# Patient Record
Sex: Male | Born: 1980 | Race: White | Hispanic: No | Marital: Married | Smoking: Never smoker
Health system: Southern US, Community
[De-identification: ages and names within clinical notes are randomized; demographics above are authoritative.]

## PROBLEM LIST (undated history)

## (undated) DIAGNOSIS — Z8781 Personal history of (healed) traumatic fracture: Secondary | ICD-10-CM

---

## 2003-10-02 DIAGNOSIS — R011 Cardiac murmur, unspecified: Secondary | ICD-10-CM

## 2003-10-02 HISTORY — DX: Cardiac murmur, unspecified: R01.1

## 2008-03-29 ENCOUNTER — Emergency Department (HOSPITAL_COMMUNITY): Admission: EM | Admit: 2008-03-29 | Discharge: 2008-03-30 | Payer: Self-pay | Admitting: Emergency Medicine

## 2008-03-30 ENCOUNTER — Encounter: Payer: Self-pay | Admitting: Internal Medicine

## 2008-04-01 ENCOUNTER — Ambulatory Visit: Payer: Self-pay | Admitting: Internal Medicine

## 2008-04-01 DIAGNOSIS — S129XXA Fracture of neck, unspecified, initial encounter: Secondary | ICD-10-CM | POA: Insufficient documentation

## 2008-04-01 DIAGNOSIS — K5900 Constipation, unspecified: Secondary | ICD-10-CM | POA: Insufficient documentation

## 2008-04-01 DIAGNOSIS — R209 Unspecified disturbances of skin sensation: Secondary | ICD-10-CM

## 2008-04-01 DIAGNOSIS — M5000 Cervical disc disorder with myelopathy, unspecified cervical region: Secondary | ICD-10-CM

## 2008-04-01 DIAGNOSIS — S139XXA Sprain of joints and ligaments of unspecified parts of neck, initial encounter: Secondary | ICD-10-CM

## 2008-04-14 ENCOUNTER — Encounter: Payer: Self-pay | Admitting: Internal Medicine

## 2008-04-15 ENCOUNTER — Encounter: Admission: RE | Admit: 2008-04-15 | Discharge: 2008-04-15 | Payer: Self-pay | Admitting: Neurological Surgery

## 2008-05-13 ENCOUNTER — Encounter: Payer: Self-pay | Admitting: Internal Medicine

## 2008-05-28 ENCOUNTER — Encounter: Payer: Self-pay | Admitting: Internal Medicine

## 2008-06-29 DIAGNOSIS — R519 Headache, unspecified: Secondary | ICD-10-CM | POA: Insufficient documentation

## 2008-07-28 ENCOUNTER — Encounter: Payer: Self-pay | Admitting: Internal Medicine

## 2008-12-08 DIAGNOSIS — J301 Allergic rhinitis due to pollen: Secondary | ICD-10-CM | POA: Insufficient documentation

## 2010-08-04 ENCOUNTER — Telehealth (INDEPENDENT_AMBULATORY_CARE_PROVIDER_SITE_OTHER): Payer: Self-pay | Admitting: *Deleted

## 2010-08-30 DIAGNOSIS — I495 Sick sinus syndrome: Secondary | ICD-10-CM | POA: Insufficient documentation
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
## 2010-10-31 NOTE — Progress Notes (Signed)
Summary: Pt switch from Dr Posey Rea to Dr Odette Fraction MD(s) ok  ---- Converted from flag ---- ---- 08/02/2010 5:37 PM, Tresa Garter MD wrote: ok with me Thank you!   ---- 08/02/2010 9:55 AM, Ivar Bury wrote: Dr Posey Rea and Dr Felicity Coyer, Dr Posey Rea your pt Howell Pringle desire to switch to Dr Felicity Coyer.  Thank you both for your reply. ------------------------------  Gave appt:  08/31/10 @ 8A w/Dr Mayra Neer

## 2012-07-02 ENCOUNTER — Ambulatory Visit: Payer: Self-pay | Admitting: Family Medicine

## 2012-08-27 ENCOUNTER — Ambulatory Visit: Payer: Self-pay | Admitting: Family Medicine

## 2012-08-27 DIAGNOSIS — Z0289 Encounter for other administrative examinations: Secondary | ICD-10-CM

## 2014-12-31 DIAGNOSIS — F419 Anxiety disorder, unspecified: Secondary | ICD-10-CM | POA: Insufficient documentation

## 2015-12-28 DIAGNOSIS — S93411A Sprain of calcaneofibular ligament of right ankle, initial encounter: Secondary | ICD-10-CM | POA: Insufficient documentation

## 2016-04-30 DIAGNOSIS — L4050 Arthropathic psoriasis, unspecified: Secondary | ICD-10-CM | POA: Insufficient documentation

## 2016-12-14 ENCOUNTER — Emergency Department (HOSPITAL_COMMUNITY)
Admission: EM | Admit: 2016-12-14 | Discharge: 2016-12-15 | Disposition: A | Discharge status: Home / Self Care | Payer: 59 | Attending: Emergency Medicine | Admitting: Emergency Medicine

## 2016-12-14 ENCOUNTER — Encounter (HOSPITAL_COMMUNITY): Payer: Self-pay | Admitting: Emergency Medicine

## 2016-12-14 ENCOUNTER — Emergency Department (HOSPITAL_COMMUNITY): Payer: 59

## 2016-12-14 DIAGNOSIS — R930 Abnormal findings on diagnostic imaging of skull and head, not elsewhere classified: Secondary | ICD-10-CM | POA: Diagnosis not present

## 2016-12-14 DIAGNOSIS — S161XXA Strain of muscle, fascia and tendon at neck level, initial encounter: Secondary | ICD-10-CM | POA: Diagnosis not present

## 2016-12-14 DIAGNOSIS — Y999 Unspecified external cause status: Secondary | ICD-10-CM | POA: Diagnosis not present

## 2016-12-14 DIAGNOSIS — Y939 Activity, unspecified: Secondary | ICD-10-CM | POA: Diagnosis not present

## 2016-12-14 DIAGNOSIS — S199XXA Unspecified injury of neck, initial encounter: Secondary | ICD-10-CM | POA: Diagnosis present

## 2016-12-14 DIAGNOSIS — Y92410 Unspecified street and highway as the place of occurrence of the external cause: Secondary | ICD-10-CM | POA: Diagnosis not present

## 2016-12-14 HISTORY — DX: Personal history of (healed) traumatic fracture: Z87.81
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
## 2016-12-14 NOTE — ED Notes (Signed)
Dr. Jodi MourningZavitz notified of pt and will come to triage to screen.
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
## 2016-12-14 NOTE — ED Notes (Signed)
Patient transported to X-ray 

## 2016-12-15 MED ORDER — TETRACAINE HCL 0.5 % OP SOLN
1.0000 [drp] | Freq: Once | OPHTHALMIC | Status: AC
  Administered 2016-12-15: 2 [drp] via OPHTHALMIC

## 2016-12-15 MED ORDER — NAPROXEN 500 MG PO TABS: 500.0000 mg | ORAL_TABLET | Freq: Two times a day (BID) | ORAL | 0 refills | Status: DC

## 2016-12-15 MED ORDER — FLUORESCEIN SODIUM 0.6 MG OP STRP
1.0000 | ORAL_STRIP | Freq: Once | OPHTHALMIC | Status: AC
  Administered 2016-12-15: 1 via OPHTHALMIC

## 2016-12-15 MED ORDER — FLUORESCEIN SODIUM 0.6 MG OP STRP
ORAL_STRIP | OPHTHALMIC | Status: AC
  Filled 2016-12-15: qty 1
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
## 2016-12-15 NOTE — Progress Notes (Signed)
Orthopedic Tech Progress Note Patient Details:  James BroodJonathan Gaines 1981/03/31 [id number]  Ortho Devices Type of Ortho Device: Soft collar Ortho Device/Splint Interventions: Ordered, Application   Trinna PostMartinez, [name] 12/15/2016, 6:47 AM

## 2016-12-15 NOTE — ED Notes (Signed)
Pt departed in NAD, refused use of wheelchair.  

## 2016-12-15 NOTE — ED Notes (Signed)
Initiated irrigation of R eye via [employer] lens.
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
## 2019-07-03 ENCOUNTER — Ambulatory Visit: Payer: Self-pay

## 2019-07-03 DIAGNOSIS — Z23 Encounter for immunization: Secondary | ICD-10-CM

## 2019-08-21 DIAGNOSIS — M542 Cervicalgia: Secondary | ICD-10-CM | POA: Diagnosis not present

## 2019-08-24 ENCOUNTER — Other Ambulatory Visit: Payer: Self-pay

## 2019-08-24 ENCOUNTER — Encounter: Payer: Self-pay | Admitting: Family Medicine

## 2019-08-24 DIAGNOSIS — Z20822 Contact with and (suspected) exposure to covid-19: Secondary | ICD-10-CM

## 2019-08-25 ENCOUNTER — Encounter: Payer: Self-pay | Admitting: Family Medicine

## 2019-08-26 LAB — NOVEL CORONAVIRUS, NAA: SARS-CoV-2, NAA: NOT DETECTED

## 2019-08-26 NOTE — Telephone Encounter (Signed)
Please see other message.

## 2019-09-04 DIAGNOSIS — M542 Cervicalgia: Secondary | ICD-10-CM | POA: Diagnosis not present

## 2019-10-22 ENCOUNTER — Ambulatory Visit: Payer: Self-pay

## 2019-10-22 ENCOUNTER — Other Ambulatory Visit: Payer: Self-pay

## 2019-10-22 DIAGNOSIS — Z0000 Encounter for general adult medical examination without abnormal findings: Secondary | ICD-10-CM

## 2019-10-22 LAB — POCT URINALYSIS DIPSTICK
Bilirubin, UA: NEGATIVE
Blood, UA: NEGATIVE
Glucose, UA: NEGATIVE
Ketones, UA: NEGATIVE
Leukocytes, UA: NEGATIVE
Nitrite, UA: NEGATIVE
Protein, UA: NEGATIVE
Spec Grav, UA: 1.01 (ref 1.010–1.025)
Urobilinogen, UA: 0.2 E.U./dL
pH, UA: 7.5 (ref 5.0–8.0)

## 2019-10-22 NOTE — Progress Notes (Signed)
Patient is here today for pre physical labs and EKG. Patient is scheduled for a physical with Anette Riedel, PA-C on 10/29/19.

## 2019-10-23 LAB — CMP12+LP+TP+TSH+6AC+PSA+CBC?
Alkaline Phosphatase: 63 IU/L (ref 39–117)
BUN/Creatinine Ratio: 14 (ref 9–20)
BUN: 17 mg/dL (ref 6–20)
Calcium: 9.3 mg/dL (ref 8.7–10.2)
EOS (ABSOLUTE): 0 10*3/uL (ref 0.0–0.4)
Eos: 1 %
Estimated CHD Risk: <0.5 times avg. (ref 0.0–1.0)
GGT: 17 IU/L (ref 0–65)
Globulin, Total: 2.3 g/dL (ref 1.5–4.5)
Hematocrit: 44.4 % (ref 37.5–51.0)
Hemoglobin: 15.4 g/dL (ref 13.0–17.7)
Immature Grans (Abs): 0.1 10*3/uL (ref 0.0–0.1)
Immature Granulocytes: 1 %
LDH: 219 IU/L (ref 121–224)
LDL Chol Calc (NIH): 85 mg/dL (ref 0–99)
MCV: 86 fL (ref 79–97)
Monocytes Absolute: 0.5 10*3/uL (ref 0.1–0.9)
Neutrophils Absolute: 6.1 10*3/uL (ref 1.4–7.0)
RDW: 12.9 % (ref 11.6–15.4)
T3 Uptake Ratio: 25 % (ref 24–39)
Total Protein: 7 g/dL (ref 6.0–8.5)
Triglycerides: 68 mg/dL (ref 0–149)
VLDL Cholesterol Cal: 14 mg/dL (ref 5–40)

## 2019-10-23 LAB — CMP12+LP+TP+TSH+6AC+PSA+CBC…
ALT: 27 IU/L (ref 0–44)
AST: 27 IU/L (ref 0–40)
Albumin/Globulin Ratio: 2 (ref 1.2–2.2)
Albumin: 4.7 g/dL (ref 4.0–5.0)
Basophils Absolute: 0.1 10*3/uL (ref 0.0–0.2)
Basos: 1 %
Bilirubin Total: 0.5 mg/dL (ref 0.0–1.2)
Chloride: 101 mmol/L (ref 96–106)
Chol/HDL Ratio: 2.9 ratio (ref 0.0–5.0)
Cholesterol, Total: 151 mg/dL (ref 100–199)
Creatinine, Ser: 1.19 mg/dL (ref 0.76–1.27)
Free Thyroxine Index: 1.5 (ref 1.2–4.9)
GFR calc Af Amer: 88 mL/min/{1.73_m2} (ref >59)
GFR calc non Af Amer: 76 mL/min/{1.73_m2} (ref >59)
Glucose: 104 mg/dL — ABNORMAL HIGH (ref 65–99)
HDL: 52 mg/dL (ref >39)
Iron: 84 ug/dL (ref 38–169)
Lymphocytes Absolute: 1.8 10*3/uL (ref 0.7–3.1)
Lymphs: 21 %
MCH: 29.7 pg (ref 26.6–33.0)
MCHC: 34.7 g/dL (ref 31.5–35.7)
Monocytes: 6 %
Neutrophils: 70 %
Phosphorus: 4.1 mg/dL (ref 2.8–4.1)
Platelets: 278 10*3/uL (ref 150–450)
Potassium: 4.6 mmol/L (ref 3.5–5.2)
Prostate Specific Ag, Serum: 0.7 ng/mL (ref 0.0–4.0)
RBC: 5.18 x10E6/uL (ref 4.14–5.80)
Sodium: 138 mmol/L (ref 134–144)
T4, Total: 5.8 ug/dL (ref 4.5–12.0)
TSH: 2.23 u[IU]/mL (ref 0.450–4.500)
Uric Acid: 5 mg/dL (ref 3.8–8.4)
WBC: 8.6 10*3/uL (ref 3.4–10.8)

## 2019-10-31 DIAGNOSIS — Z20822 Contact with and (suspected) exposure to covid-19: Secondary | ICD-10-CM | POA: Diagnosis not present

## 2019-11-02 ENCOUNTER — Ambulatory Visit: Payer: PRIVATE HEALTH INSURANCE | Attending: Internal Medicine

## 2019-11-05 DIAGNOSIS — Z20822 Contact with and (suspected) exposure to covid-19: Secondary | ICD-10-CM | POA: Diagnosis not present

## 2019-11-12 ENCOUNTER — Other Ambulatory Visit: Payer: Self-pay | Admitting: General Practice

## 2019-11-12 NOTE — Telephone Encounter (Addendum)
Citolopram 10mg  1 daily  Buprisone 150mg  1 daily  He has been out for about . His old provider will not bridge him. He is in quarantine until Feb 22nd. He is due to come in on 12/03/2019 for his phys. Pharmacy is [facility] in [place].

## 2019-11-30 DIAGNOSIS — Z135 Encounter for screening for eye and ear disorders: Secondary | ICD-10-CM | POA: Diagnosis not present

## 2019-11-30 DIAGNOSIS — H5210 Myopia, unspecified eye: Secondary | ICD-10-CM | POA: Diagnosis not present

## 2019-12-03 ENCOUNTER — Ambulatory Visit: Payer: Self-pay | Admitting: Physician Assistant

## 2019-12-03 ENCOUNTER — Other Ambulatory Visit: Payer: Self-pay

## 2019-12-03 ENCOUNTER — Encounter: Payer: Self-pay | Admitting: Physician Assistant

## 2019-12-03 VITALS — BP 124/70 | HR 65 | Temp 98.5°F | Resp 12 | Ht 68.5 in | Wt 189.0 lb

## 2019-12-03 DIAGNOSIS — Z0000 Encounter for general adult medical examination without abnormal findings: Secondary | ICD-10-CM

## 2019-12-03 MED ORDER — BUPROPION HCL ER (XL) 150 MG PO TB24: 150.0000 mg | ORAL_TABLET | Freq: Every day | ORAL | 0 refills | Status: DC

## 2019-12-03 MED ORDER — CITALOPRAM HYDROBROMIDE 10 MG PO TABS: 10.0000 mg | ORAL_TABLET | Freq: Every day | ORAL | 0 refills | Status: DC

## 2019-12-03 NOTE — Progress Notes (Signed)
   Subjective: Annual physical exam    Patient ID: James Gaines, male    DOB: [birth year], 39 y.o.   MRN: [id number]  HPI Patient presents for annual physical exam refill maintenance medications for anxiety and depression.   Review of Systems Anxiety and depression    Objective:   Physical Exam HEENT is unremarkable.  Neck is supple for adenopathy or bruits.  Lungs are clear to auscultation.  Heart regular rate and rhythm.  Abdomen with normoactive bowel sounds, negative HSM, soft and nontender to palpation.  No obvious deformity to the upper or lower extremities.  Patient has full equal range of motion to the upper and lower extremities.  No obvious deformity to cervical lumbar spine.  Patient full equal range of motion of the cervical lumbar spine.  Cranial nerves II through XII are grossly intact.  No acute findings on labs.  EKG with asymptomatic bradycardia.    Assessment & Plan: Well exam  Patient prescription for Wellbutrin and Celexa were refilled.  Follow-up as needed.

## 2019-12-29 DIAGNOSIS — M542 Cervicalgia: Secondary | ICD-10-CM | POA: Diagnosis not present

## 2020-01-19 ENCOUNTER — Other Ambulatory Visit: Payer: Self-pay | Admitting: Physician Assistant

## 2020-03-01 ENCOUNTER — Other Ambulatory Visit: Payer: Self-pay

## 2020-03-01 DIAGNOSIS — F419 Anxiety disorder, unspecified: Secondary | ICD-10-CM

## 2020-03-01 MED ORDER — BUPROPION HCL ER (XL) 150 MG PO TB24: 150.0000 mg | ORAL_TABLET | Freq: Every day | ORAL | 2 refills | Status: DC

## 2020-03-01 MED ORDER — CITALOPRAM HYDROBROMIDE 10 MG PO TABS: 10.0000 mg | ORAL_TABLET | Freq: Every day | ORAL | 2 refills | Status: DC

## 2020-03-04 DIAGNOSIS — M542 Cervicalgia: Secondary | ICD-10-CM | POA: Diagnosis not present

## 2020-03-28 ENCOUNTER — Other Ambulatory Visit: Payer: Self-pay

## 2020-03-28 DIAGNOSIS — F419 Anxiety disorder, unspecified: Secondary | ICD-10-CM

## 2020-03-28 DIAGNOSIS — F329 Major depressive disorder, single episode, unspecified: Secondary | ICD-10-CM

## 2020-04-05 ENCOUNTER — Other Ambulatory Visit: Payer: Self-pay

## 2020-04-05 MED ORDER — CITALOPRAM HYDROBROMIDE 10 MG PO TABS: 10.0000 mg | ORAL_TABLET | Freq: Every day | ORAL | 2 refills | Status: DC

## 2020-04-18 ENCOUNTER — Other Ambulatory Visit: Payer: Self-pay

## 2020-04-18 DIAGNOSIS — F419 Anxiety disorder, unspecified: Secondary | ICD-10-CM

## 2020-04-18 MED ORDER — BUPROPION HCL ER (XL) 150 MG PO TB24: 150.0000 mg | ORAL_TABLET | Freq: Every day | ORAL | 2 refills | Status: DC

## 2020-06-29 DIAGNOSIS — M25512 Pain in left shoulder: Secondary | ICD-10-CM | POA: Diagnosis not present

## 2020-06-29 DIAGNOSIS — M542 Cervicalgia: Secondary | ICD-10-CM | POA: Diagnosis not present

## 2020-07-06 DIAGNOSIS — M25512 Pain in left shoulder: Secondary | ICD-10-CM | POA: Diagnosis not present

## 2020-07-06 DIAGNOSIS — M542 Cervicalgia: Secondary | ICD-10-CM | POA: Diagnosis not present

## 2020-07-19 DIAGNOSIS — M542 Cervicalgia: Secondary | ICD-10-CM | POA: Diagnosis not present

## 2020-07-19 DIAGNOSIS — M25512 Pain in left shoulder: Secondary | ICD-10-CM | POA: Diagnosis not present

## 2020-07-27 DIAGNOSIS — M25512 Pain in left shoulder: Secondary | ICD-10-CM | POA: Diagnosis not present

## 2020-07-27 DIAGNOSIS — M542 Cervicalgia: Secondary | ICD-10-CM | POA: Diagnosis not present

## 2020-08-10 ENCOUNTER — Ambulatory Visit: Payer: Self-pay

## 2020-08-10 DIAGNOSIS — Z23 Encounter for immunization: Secondary | ICD-10-CM

## 2020-09-09 ENCOUNTER — Other Ambulatory Visit: Payer: Self-pay

## 2020-09-09 DIAGNOSIS — F419 Anxiety disorder, unspecified: Secondary | ICD-10-CM

## 2020-09-09 MED ORDER — BUPROPION HCL ER (XL) 150 MG PO TB24: 150.0000 mg | ORAL_TABLET | Freq: Every day | ORAL | 2 refills | Status: DC

## 2020-09-09 MED ORDER — CITALOPRAM HYDROBROMIDE 10 MG PO TABS: 10.0000 mg | ORAL_TABLET | Freq: Every day | ORAL | 2 refills | Status: DC

## 2020-09-20 DIAGNOSIS — Z1152 Encounter for screening for COVID-19: Secondary | ICD-10-CM | POA: Diagnosis not present

## 2020-09-20 DIAGNOSIS — Z03818 Encounter for observation for suspected exposure to other biological agents ruled out: Secondary | ICD-10-CM | POA: Diagnosis not present

## 2021-02-09 ENCOUNTER — Ambulatory Visit: Payer: Self-pay | Admitting: Physician Assistant

## 2021-02-09 ENCOUNTER — Ambulatory Visit
Admission: RE | Admit: 2021-02-09 | Discharge: 2021-02-09 | Disposition: A | Discharge status: Home / Self Care | Payer: PRIVATE HEALTH INSURANCE | Source: Ambulatory Visit | Attending: Physician Assistant | Admitting: Physician Assistant

## 2021-02-09 ENCOUNTER — Encounter: Payer: Self-pay | Admitting: Physician Assistant

## 2021-02-09 ENCOUNTER — Ambulatory Visit
Admission: RE | Admit: 2021-02-09 | Discharge: 2021-02-09 | Disposition: A | Discharge status: Home / Self Care | Payer: PRIVATE HEALTH INSURANCE | Attending: Physician Assistant | Admitting: Physician Assistant

## 2021-02-09 ENCOUNTER — Other Ambulatory Visit: Payer: Self-pay

## 2021-02-09 VITALS — BP 128/88 | HR 48 | Temp 98.2°F | Resp 14 | Ht 68.75 in | Wt 198.0 lb

## 2021-02-09 DIAGNOSIS — Z0000 Encounter for general adult medical examination without abnormal findings: Secondary | ICD-10-CM

## 2021-02-09 DIAGNOSIS — M25562 Pain in left knee: Secondary | ICD-10-CM

## 2021-02-09 LAB — POCT URINALYSIS DIPSTICK
Bilirubin, UA: NEGATIVE
Blood, UA: NEGATIVE
Glucose, UA: NEGATIVE
Ketones, UA: NEGATIVE
Leukocytes, UA: NEGATIVE
Nitrite, UA: NEGATIVE
Protein, UA: NEGATIVE
Spec Grav, UA: 1.01 (ref 1.010–1.025)
Urobilinogen, UA: 0.2 E.U./dL
pH, UA: 6 (ref 5.0–8.0)

## 2021-02-09 MED ORDER — DICLOFENAC SODIUM 1 % EX GEL: 2.0000 g | Freq: Four times a day (QID) | CUTANEOUS | Status: DC

## 2021-02-09 NOTE — Progress Notes (Signed)
Icing & Ibuprofen  No known injury  Bothering him x5 days  States feels like there's fluid there  AMD

## 2021-02-09 NOTE — Addendum Note (Signed)
Addended by: Christianne Dolin F on: 02/09/2021 09:33 AM   Modules accepted: Orders

## 2021-02-09 NOTE — Progress Notes (Signed)
   Subjective: Left knee pain    Patient ID: James Gaines, male    DOB: 09/18/1981, 40 y.o.   MRN: [id number]  HPI Patient presents with 5 days of left knee pain.  Patient denies provocative incident for complaint.  Patient states he feels like there is "fluid on his knees.   Review of Systems    Anxiety Objective:   Physical Exam No acute distress.  Temperature 98.2, pulse 48, respiration 14, BP is 128/88, and patient is 98% O2 sat on room air. Patient ambulates with atypical gait.  No obvious deformity to the left knee.  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 Mild crepitus to palpation.  Mild anterior patella edema.  No obvious erythema.  Moderate guarding palpation.       Assessment & Plan: Left knee pain  Differentials consist of patellar bursitis, arthritis, or gout. Further evaluation will consist of x-ray of the left knee, uric acid level, and sed rate.  Patient will follow up status post labs.  Patient given prescription for Voltaren cream to apply as directed.

## 2021-02-10 LAB — CMP12+LP+TP+TSH+6AC+PSA+CBC…
ALT: 29 IU/L (ref 0–44)
AST: 28 IU/L (ref 0–40)
Albumin/Globulin Ratio: 2.4 — ABNORMAL HIGH (ref 1.2–2.2)
Albumin: 4.8 g/dL (ref 4.0–5.0)
Alkaline Phosphatase: 67 IU/L (ref 44–121)
BUN/Creatinine Ratio: 15 (ref 9–20)
BUN: 16 mg/dL (ref 6–24)
Basophils Absolute: 0 10*3/uL (ref 0.0–0.2)
Basos: 0 %
Bilirubin Total: 0.3 mg/dL (ref 0.0–1.2)
Calcium: 9.6 mg/dL (ref 8.7–10.2)
Chloride: 101 mmol/L (ref 96–106)
Chol/HDL Ratio: 3.3 ratio (ref 0.0–5.0)
Cholesterol, Total: 174 mg/dL (ref 100–199)
Creatinine, Ser: 1.06 mg/dL (ref 0.76–1.27)
EOS (ABSOLUTE): 0.4 10*3/uL (ref 0.0–0.4)
Eos: 4 %
Estimated CHD Risk: <0.5 times avg. (ref 0.0–1.0)
Free Thyroxine Index: 1.7 (ref 1.2–4.9)
GGT: 20 IU/L (ref 0–65)
Globulin, Total: 2 g/dL (ref 1.5–4.5)
Glucose: 106 mg/dL — ABNORMAL HIGH (ref 65–99)
HDL: 53 mg/dL (ref >39)
Hematocrit: 45.7 % (ref 37.5–51.0)
Hemoglobin: 15.4 g/dL (ref 13.0–17.7)
Immature Grans (Abs): 0.1 10*3/uL (ref 0.0–0.1)
Immature Granulocytes: 1 %
Iron: 44 ug/dL (ref 38–169)
LDH: 212 IU/L (ref 121–224)
LDL Chol Calc (NIH): 108 mg/dL — ABNORMAL HIGH (ref 0–99)
Lymphocytes Absolute: 2.3 10*3/uL (ref 0.7–3.1)
Lymphs: 24 %
MCH: 29 pg (ref 26.6–33.0)
MCHC: 33.7 g/dL (ref 31.5–35.7)
MCV: 86 fL (ref 79–97)
Monocytes Absolute: 0.6 10*3/uL (ref 0.1–0.9)
Monocytes: 6 %
Neutrophils Absolute: 6.1 10*3/uL (ref 1.4–7.0)
Neutrophils: 65 %
Phosphorus: 3.1 mg/dL (ref 2.8–4.1)
Platelets: 288 10*3/uL (ref 150–450)
Potassium: 4.8 mmol/L (ref 3.5–5.2)
Prostate Specific Ag, Serum: 0.6 ng/mL (ref 0.0–4.0)
RBC: 5.31 x10E6/uL (ref 4.14–5.80)
RDW: 13.3 % (ref 11.6–15.4)
Sodium: 137 mmol/L (ref 134–144)
T3 Uptake Ratio: 27 % (ref 24–39)
T4, Total: 6.4 ug/dL (ref 4.5–12.0)
TSH: 2 u[IU]/mL (ref 0.450–4.500)
Total Protein: 6.8 g/dL (ref 6.0–8.5)
Triglycerides: 70 mg/dL (ref 0–149)
Uric Acid: 4.3 mg/dL (ref 3.8–8.4)
VLDL Cholesterol Cal: 13 mg/dL (ref 5–40)
WBC: 9.4 10*3/uL (ref 3.4–10.8)
eGFR: 90 mL/min/{1.73_m2} (ref >59)

## 2021-02-10 LAB — SEDIMENTATION RATE: Sed Rate: 4 mm/hr (ref 0–15)

## 2021-02-20 ENCOUNTER — Encounter: Payer: Self-pay | Admitting: Adult Health

## 2021-02-20 ENCOUNTER — Other Ambulatory Visit: Payer: Self-pay

## 2021-02-20 ENCOUNTER — Ambulatory Visit: Payer: Self-pay | Admitting: Adult Health

## 2021-02-20 VITALS — BP 126/69 | HR 74 | Temp 98.0°F | Resp 14 | Ht 68.75 in | Wt 190.0 lb

## 2021-02-20 DIAGNOSIS — R6882 Decreased libido: Secondary | ICD-10-CM

## 2021-02-20 DIAGNOSIS — L409 Psoriasis, unspecified: Secondary | ICD-10-CM

## 2021-02-20 DIAGNOSIS — G43909 Migraine, unspecified, not intractable, without status migrainosus: Secondary | ICD-10-CM

## 2021-02-20 DIAGNOSIS — Z3009 Encounter for other general counseling and advice on contraception: Secondary | ICD-10-CM

## 2021-02-20 DIAGNOSIS — Z0000 Encounter for general adult medical examination without abnormal findings: Secondary | ICD-10-CM

## 2021-02-20 DIAGNOSIS — R5383 Other fatigue: Secondary | ICD-10-CM

## 2021-02-20 MED ORDER — NURTEC 75 MG PO TBDP
1.0000 | ORAL_TABLET | ORAL | 2 refills | Status: DC | PRN
Start: 2021-02-20 — End: 2021-04-10

## 2021-02-20 NOTE — Progress Notes (Signed)
[facility], [place] [address]  Internal MEDICINE  Office Visit Note  Patient Name: James Gaines  [id number]  [id number]  Date of Service: 02/20/2021  Chief Complaint  Patient presents with  . Annual Exam     HPI Pt is here for routine health maintenance examination. He is a well appearing 40 yo male.  He is joined in room today by his Wife.  They have a 76 year old son.  Medications, surgical history and labs reviewed with patient.  He has multiple concerns/request today. He would like referrals for vasectomy as well as dermatology for his psoriasis. He also reports some increased fatigue as well as decreased libido. He also has been using Nurtec for Migraines and would like a refill.      Current Medication: Outpatient Encounter Medications as of 02/20/2021  Medication Sig  . buPROPion (WELLBUTRIN XL) 150 MG 24 hr tablet Take 1 tablet (150 mg total) by mouth daily.  . Cholecalciferol (VITAMIN D3) 125 MCG (5000 UT) TABS Take by mouth daily.  . citalopram (CELEXA) 10 MG tablet Take 1 tablet (10 mg total) by mouth daily.  . Omega-3 Fatty Acids (FISH OIL) 1000 MG CAPS Take by mouth 1 day or 1 dose.   Facility-Administered Encounter Medications as of 02/20/2021  Medication  . diclofenac Sodium (VOLTAREN) 1 % topical gel 2 g    Surgical History: Past Surgical History:  Procedure Laterality Date  . TONSILECTOMY, ADENOIDECTOMY, BILATERAL MYRINGOTOMY AND TUBES  1999   High School    Medical History: Past Medical History:  Diagnosis Date  . H/O cervical fracture     Family History: Family History  Problem Relation Age of Onset  . Cancer Mother   . Breast cancer Mother   . Cerebral aneurysm Father   . Stroke Father   . Skin cancer Father   . Heart disease Father   . Breast cancer Maternal Grandmother   . Heart attack Paternal Grandfather   . Lung cancer Paternal Grandfather   . Pancreatic cancer Maternal Uncle   . Heart attack Paternal  Uncle     Social History: Social History   Socioeconomic History  . Marital status: Married    Spouse name: Not on file  . Number of children: Not on file  . Years of education: Not on file  . Highest education level: Not on file  Occupational History  . Not on file  Tobacco Use  . Smoking status: Never Smoker  . Smokeless tobacco: Never Used  Substance and Sexual Activity  . Alcohol use: No  . Drug use: No  . Sexual activity: Not on file  Other Topics Concern  . Not on file  Social History Narrative  . Not on file   Social Determinants of Health   Financial Resource Strain: Not on file  Food Insecurity: Not on file  Transportation Needs: Not on file  Physical Activity: Not on file  Stress: Not on file  Social Connections: Not on file      Review of Systems  Constitutional: Negative for activity change, appetite change and fatigue.  HENT: Negative for congestion, sinus pain, trouble swallowing and voice change.   Eyes: Negative for pain, discharge and visual disturbance.  Respiratory: Negative for cough, chest tightness and shortness of breath.   Cardiovascular: Negative for chest pain and leg swelling.  Gastrointestinal: Negative for abdominal distention, abdominal pain, constipation and diarrhea.  Musculoskeletal: Negative for arthralgias, back pain and neck pain.  Skin: Positive for rash. Negative for color change.  Neurological: Negative for dizziness, weakness and headaches.  Hematological: Negative for adenopathy.  Psychiatric/Behavioral: Negative for agitation, confusion and suicidal ideas.     Vital Signs: BP 126/69   Pulse 74   Temp 98 F (36.7 C)   Resp 14   Ht 5' 8.75" (1.746 m)   Wt 190 lb (86.2 kg)   SpO2 98%   BMI 28.26 kg/m    Physical Exam Constitutional:      Appearance: Normal appearance.  HENT:     Head: Normocephalic.     Right Ear: Tympanic membrane normal.     Left Ear: Tympanic membrane normal.     Nose: Nose normal.      Mouth/Throat:     Mouth: Mucous membranes are moist.     Pharynx: No oropharyngeal exudate or posterior oropharyngeal erythema.  Eyes:     General:        Right eye: No discharge.        Left eye: No discharge.     Extraocular Movements: Extraocular movements intact.     Pupils: Pupils are equal, round, and reactive to light.  Cardiovascular:     Rate and Rhythm: Normal rate and regular rhythm.     Pulses: Normal pulses.     Heart sounds: Normal heart sounds. No murmur heard.   Pulmonary:     Effort: Pulmonary effort is normal. No respiratory distress.     Breath sounds: Normal breath sounds. No wheezing or rhonchi.  Abdominal:     General: Abdomen is flat. Bowel sounds are normal. There is no distension.     Palpations: There is no mass.     Tenderness: There is no abdominal tenderness. There is no guarding.     Hernia: No hernia is present.  Musculoskeletal:        General: No swelling or deformity. Normal range of motion.     Cervical back: Normal range of motion.  Skin:    General: Skin is warm and dry.     Capillary Refill: Capillary refill takes less than 2 seconds.     Comments: Has psoriasis to elbows, groin, buttocks   Neurological:     General: No focal deficit present.     Mental Status: He is alert.     Cranial Nerves: No cranial nerve deficit.     Gait: Gait normal.  Psychiatric:        Mood and Affect: Mood normal.        Behavior: Behavior normal.        Judgment: Judgment normal.      LABS: Recent Results (from the past 2160 hour(s))  CMP12+LP+TP+TSH+6AC+PSA+CBC.     Status: Abnormal   Collection Time: 02/09/21  9:33 AM  Result Value Ref Range   Glucose 106 (H) 65 - 99 mg/dL   Uric Acid 4.3 3.8 - 8.4 mg/dL    Comment:            Therapeutic target for gout patients: <6.0   BUN 16 6 - 24 mg/dL   Creatinine, Ser 1.06 0.76 - 1.27 mg/dL   eGFR 90 >59 mL/min/1.73   BUN/Creatinine Ratio 15 9 - 20   Sodium 137 134 - 144 mmol/L   Potassium 4.8 3.5 - 5.2  mmol/L   Chloride 101 96 - 106 mmol/L   Calcium 9.6 8.7 - 10.2 mg/dL   Phosphorus 3.1 2.8 - 4.1 mg/dL   Total Protein 6.8 6.0 - 8.5  g/dL   Albumin 4.8 4.0 - 5.0 g/dL   Globulin, Total 2.0 1.5 - 4.5 g/dL   Albumin/Globulin Ratio 2.4 (H) 1.2 - 2.2   Bilirubin Total 0.3 0.0 - 1.2 mg/dL   Alkaline Phosphatase 67 44 - 121 IU/L   LDH 212 121 - 224 IU/L   AST 28 0 - 40 IU/L   ALT 29 0 - 44 IU/L   GGT 20 0 - 65 IU/L   Iron 44 38 - 169 ug/dL   Cholesterol, Total 174 100 - 199 mg/dL   Triglycerides 70 0 - 149 mg/dL   HDL 53 >39 mg/dL   VLDL Cholesterol Cal 13 5 - 40 mg/dL   LDL Chol Calc (NIH) 108 (H) 0 - 99 mg/dL   Chol/HDL Ratio 3.3 0.0 - 5.0 ratio    Comment:                                   T. Chol/HDL Ratio                                             Men  Women                               1/2 Avg.Risk  3.4    3.3                                   Avg.Risk  5.0    4.4                                2X Avg.Risk  9.6    7.1                                3X Avg.Risk 23.4   11.0    Estimated CHD Risk  < 0.5 0.0 - 1.0 times avg.    Comment: The CHD Risk is based on the T. Chol/HDL ratio. Other factors affect CHD Risk such as hypertension, smoking, diabetes, severe obesity, and family history of premature CHD.    TSH 2.000 0.450 - 4.500 uIU/mL   T4, Total 6.4 4.5 - 12.0 ug/dL   T3 Uptake Ratio 27 24 - 39 %   Free Thyroxine Index 1.7 1.2 - 4.9   Prostate Specific Ag, Serum 0.6 0.0 - 4.0 ng/mL    Comment: Roche ECLIA methodology. According to the American Urological Association, Serum PSA should decrease and remain at undetectable levels after radical prostatectomy. The AUA defines biochemical recurrence as an initial PSA value 0.2 ng/mL or greater followed by a subsequent confirmatory PSA value 0.2 ng/mL or greater. Values obtained with different assay methods or kits cannot be used interchangeably. Results cannot be interpreted as absolute evidence of the presence or absence of  malignant disease.    WBC 9.4 3.4 - 10.8 x10E3/uL   RBC 5.31 4.14 - 5.80 x10E6/uL   Hemoglobin 15.4 13.0 - 17.7 g/dL   Hematocrit 45.7 37.5 - 51.0 %   MCV 86 79 - 97 fL   MCH 29.0 26.6 -  33.0 pg   MCHC 33.7 31.5 - 35.7 g/dL   RDW 13.3 11.6 - 15.4 %   Platelets 288 150 - 450 x10E3/uL   Neutrophils 65 Not Estab. %   Lymphs 24 Not Estab. %   Monocytes 6 Not Estab. %   Eos 4 Not Estab. %   Basos 0 Not Estab. %   Neutrophils Absolute 6.1 1.4 - 7.0 x10E3/uL   Lymphocytes Absolute 2.3 0.7 - 3.1 x10E3/uL   Monocytes Absolute 0.6 0.1 - 0.9 x10E3/uL   EOS (ABSOLUTE) 0.4 0.0 - 0.4 x10E3/uL   Basophils Absolute 0.0 0.0 - 0.2 x10E3/uL   Immature Granulocytes 1 Not Estab. %   Immature Grans (Abs) 0.1 0.0 - 0.1 x10E3/uL  Sedimentation rate     Status: None   Collection Time: 02/09/21  9:33 AM  Result Value Ref Range   Sed Rate 4 0 - 15 mm/hr  POCT urinalysis dipstick     Status: None   Collection Time: 02/09/21  9:38 AM  Result Value Ref Range   Color, UA Pale Yellow    Clarity, UA Clear    Glucose, UA Negative Negative   Bilirubin, UA Negative    Ketones, UA Negative    Spec Grav, UA 1.010 1.010 - 1.025   Blood, UA Negative    pH, UA 6.0 5.0 - 8.0   Protein, UA Negative Negative   Urobilinogen, UA 0.2 0.2 or 1.0 E.U./dL   Nitrite, UA Negative    Leukocytes, UA Negative Negative   Appearance     Odor       Assessment/Plan: 1. Annual physical exam Up to date on PHM.  Repeat in one year as planned.  - EKG 12-Lead MY read.  NSR.   2. Fatigue, unspecified type Check labs and follow up.  - B12 and Folate Panel - Vitamin D (25 hydroxy) - Prolactin - Testosterone,Free and Total  3. Decreased libido Check Testosterone level.  Also discussed stress, diet and exercise and their effects on Libido.   4. Psoriasis Discussed dangers of prolonged steroid use, will see Derm for next steps.  - Ambulatory referral to Dermatology  5. Vasectomy evaluation - Ambulatory referral to  Urology  6. Migraine without status migrainosus, not intractable, unspecified migraine type Use as prescribed.  - Rimegepant Sulfate (NURTEC) 75 MG TBDP; Place 1 tablet under the tongue as needed.  Dispense: 30 tablet; Refill: 2    General Counseling: Karnell verbalizes understanding of the findings of todays visit and agrees with plan of treatment. I have discussed any further diagnostic evaluation that may be needed or ordered today. We also reviewed his medications today. he has been encouraged to call the office with any questions or concerns that should arise related to todays visit.    Orders Placed This Encounter  Procedures  . EKG 12-Lead    No orders of the defined types were placed in this encounter.   Total time spent: 41 Minutes  Time spent includes review of chart, medications, test results, and follow up plan with the patient.    Kendell Bane AGNP-C Nurse Practitioner

## 2021-02-21 LAB — TESTOSTERONE,FREE AND TOTAL
Testosterone, Free: 11.7 pg/mL (ref 6.8–21.5)
Testosterone: 446 ng/dL (ref 264–916)

## 2021-02-21 LAB — PROLACTIN: Prolactin: 10.2 ng/mL (ref 4.0–15.2)

## 2021-02-21 LAB — B12 AND FOLATE PANEL
Folate: >20 ng/mL (ref >3.0)
Vitamin B-12: 748 pg/mL (ref 232–1245)

## 2021-02-21 LAB — VITAMIN D 25 HYDROXY (VIT D DEFICIENCY, FRACTURES): Vit D, 25-Hydroxy: 75 ng/mL (ref 30.0–100.0)

## 2021-03-23 DIAGNOSIS — H5213 Myopia, bilateral: Secondary | ICD-10-CM | POA: Diagnosis not present

## 2021-04-10 ENCOUNTER — Other Ambulatory Visit: Payer: Self-pay

## 2021-04-10 DIAGNOSIS — G43909 Migraine, unspecified, not intractable, without status migrainosus: Secondary | ICD-10-CM

## 2021-04-10 DIAGNOSIS — F419 Anxiety disorder, unspecified: Secondary | ICD-10-CM

## 2021-04-10 DIAGNOSIS — L409 Psoriasis, unspecified: Secondary | ICD-10-CM

## 2021-04-10 MED ORDER — NURTEC 75 MG PO TBDP
1.0000 | ORAL_TABLET | ORAL | 2 refills | Status: AC | PRN
Start: 2021-04-10 — End: ?

## 2021-04-10 MED ORDER — BUPROPION HCL ER (XL) 150 MG PO TB24: 150.0000 mg | ORAL_TABLET | Freq: Every day | ORAL | 2 refills | Status: DC

## 2021-04-10 MED ORDER — DICLOFENAC SODIUM 1 % EX GEL: 2.0000 g | Freq: Four times a day (QID) | CUTANEOUS | 2 refills | Status: AC

## 2021-04-10 MED ORDER — CITALOPRAM HYDROBROMIDE 10 MG PO TABS: 10.0000 mg | ORAL_TABLET | Freq: Every day | ORAL | 2 refills | Status: DC

## 2021-04-26 ENCOUNTER — Ambulatory Visit (INDEPENDENT_AMBULATORY_CARE_PROVIDER_SITE_OTHER): Payer: 59 | Admitting: Urology

## 2021-04-26 ENCOUNTER — Encounter: Payer: Self-pay | Admitting: Urology

## 2021-04-26 ENCOUNTER — Other Ambulatory Visit: Payer: Self-pay

## 2021-04-26 VITALS — BP 124/69 | HR 65 | Ht 68.0 in | Wt 190.0 lb

## 2021-04-26 DIAGNOSIS — Z3009 Encounter for other general counseling and advice on contraception: Secondary | ICD-10-CM

## 2021-04-26 MED ORDER — DIAZEPAM 5 MG PO TABS: 5.0000 mg | ORAL_TABLET | Freq: Once | ORAL | 0 refills | Status: AC | PRN

## 2021-04-26 NOTE — Patient Instructions (Signed)
Pre-Vasectomy Instructions  STOP all aspirin or blood thinners (Aspirin, Plavix, Coumadin, Warfarin, Motrin, Ibuprofen, Advil, Aleve, Naproxen, Naprosyn) for 7 days prior to the procedure.  If you have any questions about stopping these medications please contact your primary care physician or cardiologist.  Shave all hair from the upper scrotum on the day of the procedure.  This means just under the penis onto the scrotal sac.  The area shaved should measure about 2-3 inches around.  You may lather the scrotum with soap and water, and shave with a safety razor.  After shaving the area, thoroughly wash the penis and the scrotum, then shower or bathe to remove all the loose hairs.  If needed, wash the area again just before coming in for your circumcision.  It is recommended to have a light meal an hour or so prior to the procedure.  Bring a scrotal support (jock strap or suspensory, or tight jockey shorts or underwear).  Wear comfortable pants or shorts.  While the actual procedure usually takes about 45 minutes, you should be prepared to stay in the office for approximately one hour.  Bring someone with you to drive you home.  If you have any questions or concerns, please feel free to call the office at [phone].  Vasectomy Vasectomy is a procedure in which the vas deferens is cut and then tied or burned (cauterized). The vas deferens is a tube that carries sperm from the testicle to the part of the body that drains urine from the bladder (urethra). This procedure blocks sperm from going through the vas deferens and penis during ejaculation. This ensures that sperm does not go into the vagina during sex. Vasectomy does not affect sexual desire or performance and does notprevent sexually transmitted infections. Vasectomy is considered a permanent and very effective form of birth control (contraception). The decision to have a vasectomy should not be made during a stressful time, such as after  the loss of a pregnancy or a divorce. You and your partner should decide on whether to have a vasectomy when you are sure that you do not wantchildren in the future. Tell a health care provider about: Any allergies you have. All medicines you are taking, including vitamins, herbs, eye drops, creams, and over-the-counter medicines. Any problems you or family members have had with anesthetic medicines. Any blood disorders you have. Any surgeries you have had. Any medical conditions you have. What are the risks? Generally, this is a safe procedure. However, problems may occur, including: Infection. Bleeding and swelling of the scrotum. The scrotum is the sac that contains the testicles, blood vessels, and structures that help deliver sperm and semen. Allergic reactions to medicines. Failure of the procedure to prevent pregnancy. There is a very small chance that the tied or cauterized ends of the vas deferens may reconnect (recanalization). If this happens, you could still make a woman pregnant. Pain in the scrotum that continues after you heal from the procedure. What happens before the procedure? Medicines Ask your health care provider about: Changing or stopping your regular medicines. This is especially important if you are taking diabetes medicines or blood thinners. Taking medicines such as aspirin and ibuprofen. These medicines can thin your blood. Do not take these medicines unless your health care provider tells you to take them. Taking over-the-counter medicines, vitamins, herbs, and supplements. You may be told to take a medicine to help you relax (sedative) a few hours before the procedure. General instructions Do not use any   products that contain nicotine or tobacco for at least 4 weeks before the procedure. These products include cigarettes, e-cigarettes, and chewing tobacco. If you need help quitting, ask your health care provider. Plan to have a responsible adult take you home  from the hospital or clinic. If you will be going home right after the procedure, plan to have a responsible adult care for you for the time you are told. This is important. Ask your health care provider: How your surgery site will be marked. What steps will be taken to help prevent infection. These steps may include: Removing hair at the surgery site. Washing skin with a germ-killing soap. Taking antibiotic medicine. What happens during the procedure?  You will be given one or more of the following: A sedative, unless you were told to take this a few hours before the procedure. A medicine to numb the area (local anesthetic). Your health care provider will feel, or palpate, for your vas deferens. To reach the vas deferens, one of two methods may be used: A very small incision may be made in your scrotum. A punctured opening may be made in your scrotum, without an incision. Your vas deferens will be pulled out of your scrotum and cut. Then, the vas deferens will be closed in one of two ways: Tied at the ends. Cauterized at the ends to seal them off. The vas deferens will be put back into your scrotum. The incision or puncture opening will be closed with absorbable stitches (sutures). The sutures will eventually dissolve and will not need to be removed after the procedure. The procedure will be repeated on the other side of your scrotum. The procedure may vary among health care providers and hospitals. What happens after the procedure? You will be monitored to make sure that you do not have problems. You will be asked not to ejaculate for at least 1 week after the procedure, or for as long as you are told. You will need to use a different form of contraception for 2-4 months after the procedure, until you have test results confirming that there are no sperm in your semen. You may be given scrotal support to wear, such as a jockstrap or underwear with a supportive pouch. If you were given a  sedative during the procedure, it can affect you for several hours. Do not drive or operate machinery until your health care provider says that it is safe. Summary Vasectomy blocks sperm from being released during ejaculation. This procedure is considered a permanent and very effective form of birth control. Your scrotum will be numbed with medicine (local anesthetic) for the procedure. After the procedure, you will be asked not to ejaculate for at least 1 week, or for as long as you are told. You will also need to use a different form of contraception until your test results confirm that there are no sperm in your semen. This information is not intended to replace advice given to you by your health care provider. Make sure you discuss any questions you have with your healthcare provider. Document Revised: 02/04/2020 Document Reviewed: 02/04/2020 Elsevier Patient Education  2022 Elsevier Inc.  

## 2021-04-26 NOTE — Progress Notes (Signed)
   04/26/21 3:49 PM   James Gaines 04-30-1981 [id number]  CC: Discuss vasectomy  HPI: Healthy 40 year old male who does not desire any further biologic pregnancies.  He and his wife have a 17-year-old child.  He denies any urinary symptoms or family history of prostate cancer.   PMH: Past Medical History:  Diagnosis Date   H/O cervical fracture    Heart murmur 2005    Surgical History: Past Surgical History:  Procedure Laterality Date   TONSILECTOMY, ADENOIDECTOMY, BILATERAL MYRINGOTOMY AND TUBES  1999   High School      Family History: Family History  Problem Relation Age of Onset   Cancer Mother    Breast cancer Mother    Cerebral aneurysm Father    Stroke Father    Skin cancer Father    Heart disease Father    Breast cancer Maternal Grandmother    Heart attack Paternal Grandfather    Lung cancer Paternal Grandfather    Pancreatic cancer Maternal Uncle    Heart attack Paternal Uncle     Social History:  reports that he has never smoked. He has never used smokeless tobacco. He reports that he does not drink alcohol and does not use drugs.  Physical Exam: BP 124/69   Pulse 65   Ht 5\' 8"  (1.727 m)   Wt 190 lb (86.2 kg)   BMI 28.89 kg/m    Constitutional:  Alert and oriented, No acute distress. Cardiovascular: No clubbing, cyanosis, or edema. Respiratory: Normal respiratory effort, no increased work of breathing. GI: Abdomen is soft, nontender, nondistended, no abdominal masses GU: Circumcised phallus with patent meatus, no lesions, testicles 20 cc and descended bilaterally without masses, vas deferens easily palpable bilaterally   Assessment & Plan:    We discussed the risks and benefits of vasectomy at length.  Vasectomy is intended to be a permanent form of contraception, and does not produce immediate sterility.  Following vasectomy another form of contraception is required until vas occlusion is confirmed by a post-vasectomy semen analysis obtained  2-3 months after the procedure.  Even after vas occlusion is confirmed, vasectomy is not 100% reliable in preventing pregnancy, and the failure rate is approximately 10/1998.  Repeat vasectomy is required in less than 1% of patients.  He should refrain from ejaculation for 1 week after vasectomy.  Options for fertility after vasectomy include vasectomy reversal, and sperm retrieval with in vitro fertilization or ICSI.  These options are not always successful and may be expensive.  Finally, there are other permanent and non-permanent alternatives to vasectomy available. There is no risk of erectile dysfunction, and the volume of semen will be similar to prior, as the majority of the ejaculate is from the prostate and seminal vesicles.   The procedure takes ~20 minutes.  We recommend patients take 5-10 mg of Valium 30 minutes prior, and he will need a driver post-procedure.  Local anesthetic is injected into the scrotal skin and a small segment of the vas deferens is removed, and the ends occluded. The complication rate is approximately 1-2%, and includes bleeding, infection, and development of chronic scrotal pain.  PLAN: Schedule vasectomy Valium sent to pharmacy   11/1998, MD 04/26/2021  [facility] [address] [place], [place] [phone]

## 2021-05-16 ENCOUNTER — Telehealth: Payer: Self-pay | Admitting: Physician Assistant

## 2021-05-16 ENCOUNTER — Encounter: Payer: Self-pay | Admitting: Urology

## 2021-05-16 MED ORDER — PSEUDOEPH-BROMPHEN-DM 30-2-10 MG/5ML PO SYRP: 5.0000 mL | ORAL_SOLUTION | Freq: Four times a day (QID) | ORAL | 0 refills | Status: DC | PRN

## 2021-05-16 MED ORDER — METHYLPREDNISOLONE 4 MG PO TBPK: ORAL_TABLET | ORAL | 0 refills | Status: AC

## 2021-05-16 NOTE — Progress Notes (Signed)
   Subjective: COVID-19    Patient ID: James Gaines, male    DOB: [birth year], 39 y.o.   MRN: [id number]  HPI Patient test positive COVID-19.  Patient is taking both vaccines and booster.  Patient symptoms consist of cough and chest congestion.  Patient stated mild body aches.  Patient denies sore throat or fever.  No recent travel or known contact with COVID-19. Review of Systems Anxiety and ADD    Objective:   Physical Exam This is a virtual visit.       Assessment & Plan: COVID-19   Discussed patient rationale for not prescribing Paxlovid.  Prescription for Bromfed-DM and Medrol Dosepak was sent to the pharmacy.  Patient advised over-the-counter Tylenol if needed for fever.

## 2021-08-08 ENCOUNTER — Encounter: Payer: Self-pay | Admitting: Urology

## 2021-08-18 ENCOUNTER — Other Ambulatory Visit: Payer: Self-pay

## 2021-08-18 ENCOUNTER — Other Ambulatory Visit: Payer: Self-pay | Admitting: Physician Assistant

## 2021-08-18 DIAGNOSIS — F419 Anxiety disorder, unspecified: Secondary | ICD-10-CM

## 2021-08-18 MED ORDER — BUPROPION HCL ER (XL) 150 MG PO TB24: 150.0000 mg | ORAL_TABLET | Freq: Every day | ORAL | 3 refills | Status: DC

## 2021-08-18 MED ORDER — CITALOPRAM HYDROBROMIDE 10 MG PO TABS
10.0000 mg | ORAL_TABLET | Freq: Every day | ORAL | 3 refills | Status: AC
Start: 2021-08-18 — End: ?

## 2021-08-18 MED ORDER — CITALOPRAM HYDROBROMIDE 10 MG PO TABS: 10.0000 mg | ORAL_TABLET | Freq: Every day | ORAL | 3 refills | Status: DC

## 2021-09-09 DIAGNOSIS — R21 Rash and other nonspecific skin eruption: Secondary | ICD-10-CM | POA: Diagnosis not present

## 2021-09-14 ENCOUNTER — Encounter: Payer: Self-pay | Admitting: Urology

## 2021-09-14 ENCOUNTER — Ambulatory Visit (INDEPENDENT_AMBULATORY_CARE_PROVIDER_SITE_OTHER): Payer: 59 | Admitting: Urology

## 2021-09-14 ENCOUNTER — Other Ambulatory Visit: Payer: Self-pay

## 2021-09-14 VITALS — BP 136/78 | HR 90 | Ht 68.0 in | Wt 190.0 lb

## 2021-09-14 DIAGNOSIS — Z302 Encounter for sterilization: Secondary | ICD-10-CM | POA: Diagnosis not present

## 2021-09-14 NOTE — Patient Instructions (Signed)

## 2021-09-14 NOTE — Progress Notes (Signed)
VASECTOMY PROCEDURE NOTE:  The patient was taken to the minor procedure room and placed in the supine position. His genitals were prepped and draped in the usual sterile fashion. The right vas deferens was brought up to the skin of the right upper scrotum. The skin overlying it was anesthetized with 1% lidocaine without epinephrine, anesthetic was also injected alongside the vas deferens in the direction of the inguinal canal. The no scalpel vasectomy instrument was used to make a small perforation in the scrotal skin. The vasectomy clamp was used to grasp the vas deferens. It was carefully dissected free from surrounding structures. A 1cm segment of the vas was removed, and the cut ends of the mucosa were cauterized. A figure of eight suture was used to perform fascial interposition. No significant bleeding was noted. The vas deferens was returned to the scrotum. The skin incision was closed with a simple interrupted stitch of 4-0 chromic.  Attention was then turned to the left side. The left vasectomy was performed in the same exact fashion. Sterile dressings were placed over each incision. The patient tolerated the procedure well.  IMPRESSION/DIAGNOSIS: The patient is a 40 year old gentleman who underwent a vasectomy today. Post-procedure instructions were reviewed. I stressed the importance of continuing to use birth control until he provides a semen specimen more than 2 months from now that demonstrates azoospermia.  We discussed return precautions including fever over 101, significant bleeding or hematoma, or uncontrolled pain. I also stressed the importance of avoiding strenuous activity for one week, no sexual activity or ejaculations for 5 days, intermittent icing over the next 48 hours, and scrotal support.   PLAN: The patient will be advised of his semen analysis results when available.  Legrand Rams, MD 09/14/2021

## 2021-09-27 ENCOUNTER — Telehealth: Payer: Self-pay

## 2021-09-27 ENCOUNTER — Other Ambulatory Visit: Payer: Self-pay | Admitting: Physician Assistant

## 2021-09-27 DIAGNOSIS — M9909 Segmental and somatic dysfunction of abdomen and other regions: Secondary | ICD-10-CM | POA: Diagnosis not present

## 2021-09-27 DIAGNOSIS — M438X9 Other specified deforming dorsopathies, site unspecified: Secondary | ICD-10-CM | POA: Diagnosis not present

## 2021-09-27 DIAGNOSIS — M47819 Spondylosis without myelopathy or radiculopathy, site unspecified: Secondary | ICD-10-CM | POA: Diagnosis not present

## 2021-09-27 MED ORDER — TERBINAFINE HCL 250 MG PO TABS: 250.0000 mg | ORAL_TABLET | Freq: Every day | ORAL | 0 refills | Status: AC

## 2021-09-27 NOTE — Telephone Encounter (Signed)
Natividad Brood called clinic requesting Rx for ringworm.  States family got a new kitten & the kitten has ringworm.  Has affected him, his wife & their 41 year old son.  Armandina Stammer is now in quarantine.  Rash on his neck - came up about 4 days ago Has been using topical lotrisome cream, showers & washing clothes daily.  Wife was able to get a Rx from her doctor that is helping her & Valon called to see if he can get something. States wife prescribed Terbinafine 250 mg, 1 tab po daily x2 weeks. Said it's already making a difference for her.  Pharmacy is [facility] in [place].  AMD

## 2021-12-04 ENCOUNTER — Other Ambulatory Visit: Payer: Self-pay | Admitting: Physician Assistant

## 2021-12-05 ENCOUNTER — Other Ambulatory Visit: Payer: Self-pay

## 2021-12-05 DIAGNOSIS — F419 Anxiety disorder, unspecified: Secondary | ICD-10-CM

## 2021-12-05 MED ORDER — BUPROPION HCL ER (XL) 150 MG PO TB24: 150.0000 mg | ORAL_TABLET | Freq: Every day | ORAL | 3 refills | Status: AC

## 2021-12-14 ENCOUNTER — Other Ambulatory Visit: Payer: Self-pay

## 2021-12-14 ENCOUNTER — Other Ambulatory Visit: Payer: 59

## 2021-12-14 DIAGNOSIS — Z302 Encounter for sterilization: Secondary | ICD-10-CM | POA: Diagnosis not present

## 2021-12-16 LAB — POST-VAS SPERM EVALUATION,QUAL: Volume: 2.1 mL

## 2021-12-25 ENCOUNTER — Telehealth: Payer: Self-pay

## 2021-12-25 NOTE — Telephone Encounter (Signed)
Called pt no answer. 1st attempt.  

## 2021-12-29 NOTE — Telephone Encounter (Signed)
Called pt no answer. 2nd attempt.  °

## 2023-03-28 DIAGNOSIS — Z760 Encounter for issue of repeat prescription: Secondary | ICD-10-CM | POA: Diagnosis not present

## 2023-03-28 DIAGNOSIS — F419 Anxiety disorder, unspecified: Secondary | ICD-10-CM | POA: Diagnosis not present

## 2023-03-28 DIAGNOSIS — F32A Depression, unspecified: Secondary | ICD-10-CM | POA: Diagnosis not present

## 2023-05-28 DIAGNOSIS — Z1331 Encounter for screening for depression: Secondary | ICD-10-CM | POA: Diagnosis not present

## 2023-05-28 DIAGNOSIS — Z79899 Other long term (current) drug therapy: Secondary | ICD-10-CM | POA: Diagnosis not present

## 2023-05-28 DIAGNOSIS — Z0000 Encounter for general adult medical examination without abnormal findings: Secondary | ICD-10-CM | POA: Diagnosis not present

## 2023-05-28 DIAGNOSIS — Z13220 Encounter for screening for lipoid disorders: Secondary | ICD-10-CM | POA: Diagnosis not present

## 2024-09-09 ENCOUNTER — Other Ambulatory Visit: Payer: Self-pay | Admitting: Medical Genetics

## 2024-10-06 ENCOUNTER — Other Ambulatory Visit

## 2024-10-14 ENCOUNTER — Other Ambulatory Visit
Admission: RE | Admit: 2024-10-14 | Discharge: 2024-10-14 | Disposition: A | Discharge status: Home / Self Care | Payer: Self-pay | Source: Ambulatory Visit | Attending: Medical Genetics | Admitting: Medical Genetics

## 2024-10-23 LAB — GENECONNECT MOLECULAR SCREEN: Genetic Analysis Overall Interpretation: NEGATIVE
